# Patient Record
Sex: Male | Born: 1940 | Race: White | Hispanic: No | Marital: Married
Health system: Midwestern US, Academic
[De-identification: ages and names within clinical notes are randomized; demographics above are authoritative.]

---

## 2017-04-18 ENCOUNTER — Emergency Department: Admit: 2017-04-18 | Discharge: 2017-04-18 | Payer: MEDICARE

## 2017-04-18 ENCOUNTER — Encounter: Admit: 2017-04-18 | Discharge: 2017-04-18 | Payer: MEDICARE

## 2017-04-18 ENCOUNTER — Emergency Department: Admit: 2017-04-18 | Discharge: 2017-04-19 | Disposition: A | Discharge status: Home / Self Care | Payer: MEDICARE

## 2017-04-18 DIAGNOSIS — E7800 Pure hypercholesterolemia, unspecified: ICD-10-CM

## 2017-04-18 DIAGNOSIS — R109 Unspecified abdominal pain: ICD-10-CM

## 2017-04-18 DIAGNOSIS — I219 Acute myocardial infarction, unspecified: ICD-10-CM

## 2017-04-18 LAB — COMPREHENSIVE METABOLIC PANEL
Lab: 0.4 mg/dL (ref 0.3–1.2)
Lab: 1.6 mg/dL — ABNORMAL HIGH (ref 0.4–1.24)
Lab: 104 MMOL/L — ABNORMAL LOW (ref 98–110)
Lab: 137 MMOL/L — ABNORMAL LOW (ref 137–147)
Lab: 20 mg/dL (ref 7–25)
Lab: 218 mg/dL — ABNORMAL HIGH (ref 70–100)
Lab: 26 MMOL/L (ref 21–30)
Lab: 3.9 g/dL (ref 3.5–5.0)
Lab: 42 mL/min — ABNORMAL LOW (ref >60)
Lab: 51 mL/min — ABNORMAL LOW (ref >60)
Lab: 6.1 g/dL (ref 6.0–8.0)
Lab: 7 K/UL (ref 3–12)
Lab: 7 U/L (ref 7–56)
Lab: 71 U/L — ABNORMAL LOW (ref 25–110)
Lab: 8.3 mg/dL — ABNORMAL LOW (ref 8.5–10.6)
Lab: 9 U/L (ref 7–40)

## 2017-04-18 LAB — CBC AND DIFF
Lab: 0 10*3/uL (ref 0–0.20)
Lab: 0.1 10*3/uL (ref 0–0.45)
Lab: 8.3 K/UL — ABNORMAL HIGH (ref 4.5–11.0)

## 2017-04-18 LAB — POC GLUCOSE: Lab: 230 mg/dL — ABNORMAL HIGH (ref 70–100)

## 2017-04-18 LAB — POC LACTATE: Lab: 2.5 MMOL/L — ABNORMAL HIGH (ref 0.5–2.0)

## 2017-04-18 LAB — LIPASE: Lab: 28 U/L — ABNORMAL LOW (ref 11–82)

## 2017-04-18 MED ORDER — IOHEXOL 350 MG IODINE/ML IV SOLN
70 mL | Freq: Once | INTRAVENOUS | 0 refills | Status: CP
Start: 2017-04-18 — End: ?
  Administered 2017-04-19: 01:00:00 70 mL via INTRAVENOUS

## 2017-04-18 MED ORDER — SODIUM CHLORIDE 0.9 % IJ SOLN
50 mL | Freq: Once | INTRAVENOUS | 0 refills | Status: CP
Start: 2017-04-18 — End: ?
  Administered 2017-04-19: 01:00:00 50 mL via INTRAVENOUS

## 2017-04-18 MED ORDER — LACTATED RINGERS IV SOLP
1000 mL | Freq: Once | INTRAVENOUS | 0 refills | Status: CP
Start: 2017-04-18 — End: ?
  Administered 2017-04-19: 02:00:00 1000 mL via INTRAVENOUS

## 2017-04-18 NOTE — ED Notes
Pt presents to ED c/o diarrhea for several weeks. He had a bowel resection in October of this year and states he had diarrhea prior to that. Pt c/o mucusy discharge from his rectum in the morning and pain near his belly button. Wife states he has had some bowel incontinence and the smell has been so bad it has made her gag. Has had to change his underwear 3 times this morning. Has had a negative test for c-diff in the last few weeks but not in the last week.     Report to [place], [facility]ity fundraiser.

## 2017-04-19 DIAGNOSIS — R1033 Periumbilical pain: ICD-10-CM

## 2017-04-19 DIAGNOSIS — R531 Weakness: ICD-10-CM

## 2017-04-19 DIAGNOSIS — E1165 Type 2 diabetes mellitus with hyperglycemia: ICD-10-CM

## 2017-04-19 DIAGNOSIS — R197 Diarrhea, unspecified: Principal | ICD-10-CM

## 2017-04-19 LAB — URINALYSIS, MICROSCOPIC

## 2017-04-19 LAB — URINALYSIS DIPSTICK
Lab: NEGATIVE
Lab: NEGATIVE

## 2017-04-19 NOTE — ED Notes
Pt to CT with Will tech.

## 2017-04-19 NOTE — ED Notes
D/C and follow up care instructions given to pt. Pt verbalizes understanding. Denies any further questions at this time. Pt out of department via wheelchair with all belongings.

## 2017-04-19 NOTE — ED Notes
Gave report to Lizzy RN.

## 2017-04-19 NOTE — ED Notes
Pt to the restroom in wheelchair.

## 2021-04-29 ENCOUNTER — Encounter: Admit: 2021-04-29 | Discharge: 2021-04-29 | Payer: MEDICARE

## 2021-05-07 ENCOUNTER — Encounter: Admit: 2021-05-07 | Discharge: 2021-05-07 | Payer: MEDICARE

## 2021-05-07 ENCOUNTER — Ambulatory Visit: Admit: 2021-05-07 | Discharge: 2021-05-07 | Payer: MEDICARE

## 2021-05-07 VITALS — BP 121/70 | HR 81 | Resp 18 | Ht 72.0 in | Wt 180.0 lb

## 2021-05-07 DIAGNOSIS — E7800 Pure hypercholesterolemia, unspecified: Secondary | ICD-10-CM

## 2021-05-07 DIAGNOSIS — I219 Acute myocardial infarction, unspecified: Secondary | ICD-10-CM

## 2021-05-07 DIAGNOSIS — M47816 Spondylosis without myelopathy or radiculopathy, lumbar region: Principal | ICD-10-CM

## 2021-05-07 DIAGNOSIS — M48062 Spinal stenosis, lumbar region with neurogenic claudication: Secondary | ICD-10-CM

## 2021-05-07 NOTE — Progress Notes
[facility] - Interventional Pain  NEW PATIENT HISTORY AND PHYSICAL  Subjective     Chief Complaint:   Chief Complaint   Patient presents with   ? Back Pain     LOW BACK PAIN, LEFT LEG PAIN       HPI: Willie Joseph is a 81 y.o. male who  has a past medical history of Diabetes mellitus, Essential hypertension, Hypercholesteremia, and Myocardial infarct (HCC). who presents for evaluation.    The pain is in the    Low back. Left leg weakness.     Pain started:    Decades ago    Initial inciting injury or event:    Mechanical injury in the 70s    Numbness/tingling:    No    The pain averages    5/10    The pain is described as    Aching, no radicular component    The pain is exacerbated by      Walking, activity. Standing and walking causes him to have claudicant symptoms in the LLE.    The pain is partially alleviated by      Resting         PRIOR MEDICATIONS:   Effective  Gabapentin  Tramadol     Ineffective      Unable to tolerate      Never  Gabapentin   Lyrica  Ami/Nortriptyline  Cymbalta  Tizanidine  NSAID  Acetaminophen    PRIOR INTERVENTIONS:   Effective      Ineffective  SCS-explanted  Back surgery at least ten years ago  Neck Surgery      Selestino EMILE MENTION denies any recent fevers, chills, infection, antibiotics, bowel or bladder incontinence, saddle anesthesia, bleeding issues.    Patient is on plavix    ROS: All 14 systems reviewed and found to be negative except as above and as follows.    Past Medical History:  Medical History:   Diagnosis Date   ? Diabetes mellitus    ? Essential hypertension    ? Hypercholesteremia    ? Myocardial infarct [facility])        Family History:  No family history on file.    Social History:  [place] [id number]    Social History     Socioeconomic History   ? Marital status: Married   Tobacco Use   ? Smoking status: Former     Packs/day: 4.00     Years: 24.00     Pack years: 96.00     Types: Cigarettes     Quit date: 05/02/1982     Years since quitting: 39.0   ? Smokeless tobacco: Never   Substance and Sexual Activity   ? Alcohol use: No     Comment: on occasion   ? Drug use: No       Allergies:  Allergies   Allergen Reactions   ? Hydrocortisone AGITATION       Medications:    Current Outpatient Medications:   ?  amLODIPine (NORVASC) 10 mg tablet, Take 1 tablet by mouth daily. (Patient taking differently: Take 2.5 mg by mouth daily.), Disp: , Rfl:   ?  ascorbic acid (vitamin C) 500 mg tablet, Take 500 mg by mouth daily., Disp: , Rfl:   ?  aspirin EC 81 mg tablet, Take 81 mg by mouth daily. Take with food., Disp: , Rfl:   ?  atorvastatin (LIPITOR) 40 mg tablet, Take 1 tablet by mouth daily., Disp:  90 tablet, Rfl: 3  ?  calcium citrate-Vitamin D3 (CITRACAL +D3) 315 mg-6.25 mcg (250 unit) tablet, Take 1 tablet by mouth daily., Disp: , Rfl:   ?  clopiDOGrel (PLAVIX) 75 mg tablet, Take 75 mg by mouth daily., Disp: , Rfl:   ?  dulaglutide (TRULICITY SC), Inject  under the skin., Disp: , Rfl:   ?  dutasteride-tamsulosin(+) (JALYN) 0.5-0.4 mg capsule, Take  by mouth daily., Disp: , Rfl:   ?  finasteride (PROSCAR) 5 mg tablet, Take 5 mg by mouth daily., Disp: , Rfl:   ?  fish oil- omega 3-DHA/EPA 300/1,000 mg capsule, Take 1 capsule by mouth daily., Disp: , Rfl:   ?  gabapentin (NEURONTIN) 300 mg capsule, Take 300 mg by mouth every 8 hours., Disp: , Rfl:   ?  glimepiride (AMARYL) 2 mg PO tablet, Take 1 Tab by mouth every morning. (Patient taking differently: Take 10 mg by mouth every morning.), Disp: 30 Tab, Rfl: 0  ?  glucosam/chondro/herb 149/hyal (GLUCOS CHOND CPLX ADVANCED PO), Take  by mouth., Disp: , Rfl:   ?  INVOKANA 300 mg tablet, , Disp: , Rfl:   ?  isosorbide mononitrate SR (IMDUR) 30 mg tablet, Take 30 mg by mouth every morning., Disp: , Rfl:   ?  levothyroxine (SYNTHROID) 25 mcg tablet, Take 25 mcg by mouth daily., Disp: , Rfl:   ?  lisinopril (PRINIVIL; ZESTRIL) 10 mg tablet, Take 20 mg by mouth twice daily., Disp: , Rfl:   ?  metformin (GLUCOPHAGE) 1,000 mg PO tablet, Take 1 Tab by mouth twice daily with meals., Disp: 60 Tab, Rfl: 0  ?  methylsulfonylmethane (MSM PO), Take  by mouth., Disp: , Rfl:   ?  metoprolol XL (TOPROL XL) 25 mg extended release tablet, Take 25 mg by mouth twice daily., Disp: , Rfl:   ?  multivitamin with minerals (MULTIVITAMIN & MINERAL FORMULA PO), Take  by mouth., Disp: , Rfl:   ?  nitroglycerin (NITROSTAT) 0.4 mg tablet, Place 0.4 mg under tongue every 5 minutes as needed for Chest Pain. [place]erance of 3 tablets, call 911., Disp: , Rfl:   ?  omeprazole DR(+) (PRILOSEC) 40 mg capsule, Take 40 mg by mouth daily before breakfast., Disp: , Rfl:   ?  Salmon Oil-Omega-3 Fatty Acids (SALMON OIL-1000) 1,000-200 mg cap, Take  by mouth., Disp: , Rfl:   ?  tamsulosin (FLOMAX) 0.4 mg capsule, , Disp: , Rfl:   ?  traMADol (ULTRAM) 50 mg tablet, Take 50 mg by mouth every 4 hours as needed for Pain., Disp: , Rfl:     Physical examination:   BP 121/70 (BP Source: Arm, Right Upper, Patient Position: Sitting)  - Pulse 81  - Resp 18  - Ht 182.9 cm (6')  - Wt 81.6 kg (180 lb)  - SpO2 97%  - BMI 24.41 kg/m?   Pain Score: Eight    General: The patient is a well-developed, well nourished 81 y.o. male in no acute distress.   HEENT: Head is normocephalic and atraumatic. Pupils are equal and reactive to light bilaterally.   Cardiac: Based on palpation, pulse appears to be regular rate and rhythm.   Pulmonary: The patient has unlabored respirations and bilateral symmetric chest excursion.   Abdomen: Soft, nontender, and nondistended. There is no rebound or guarding.   Extremities: No clubbing, cyanosis, or edema.     Neurologic:   The patient is alert and oriented times 3.   Cranial nerves II through XII  are intact without any focal deficits.     Musculoskeletal:   Gait is unsteady.    L-Spine   There is no paraspinal tenderness. Paraspinal muscle tone is normal.  Facet loading is negative.  There is no tenderness or radiating pain with palpation over the SI joints, piriformis, or greater trochanteric bursae bilaterally.  FABER is negative.  ROM with flexion, extension, rotation, and lateral bending is intact.  Strength is equal and adequate bilaterally in the flexors and extensors of the bilateral lower extremities.   Reflexes are 2/4 at the patella and achilles bilaterally.   SLR is negative bilaterally.      MRI C-Spine Results:    No results found for this or any previous visit.    No results found for this or any previous visit.    No results found for this or any previous visit.    No results found for this or any previous visit.      MRI L-Spine Results:    No results found for this or any previous visit.    No results found for this or any previous visit.    No results found for this or any previous visit.    No results found for this or any previous visit.     No results found for this or any previous visit.      MRI T-Spine Results:    No results found for this or any previous visit.    No results found for this or any previous visit.    No results found for this or any previous visit.    No results found for this or any previous visit.      Last Cr and LFT's:  Creatinine   Date Value Ref Range Status   04/18/2017 1.60 (H) 0.4 - 1.24 MG/DL Final     AST (SGOT)   Date Value Ref Range Status   04/18/2017 9 7 - 40 U/L Final     ALT (SGPT)   Date Value Ref Range Status   04/18/2017 7 7 - 56 U/L Final     Alk Phosphatase   Date Value Ref Range Status   04/18/2017 71 25 - 110 U/L Final     Total Bilirubin   Date Value Ref Range Status   04/18/2017 0.4 0.3 - 1.2 MG/DL Final          Assessment:    Willie Joseph is a 81 y.o. male who  has a past medical history of Diabetes mellitus, Essential hypertension, Hypercholesteremia, and Myocardial infarct (HCC). who presents for evaluation of pain.    The pain complaints are most likely due to:    1. Spondylosis without myelopathy or radiculopathy, lumbar region            Patient has had an adequate trial of > 3 month of rest, exercise, multimodal treatment, and the passage of time without improvement of symptoms. The pain has significant impact on the daily quality of life.     Plan:  1. Continue current regimen.   2. OSH  MRI L-spine wo contrast obtained  3. Continue lifestyle modifications. Will refer to formalized outpatient PT.   4. Follow up in six weeks.    Risks/benefits of all pharmacologic and interventional treatments discussed and questions answered.     Annye Rusk, MD  Interventional Pain Medicine Fellow, PGY-5  Available on Voalte      Thank you for this kind referral for consultation. Please feel  free to contact me with any questions or concerns.     ATTESTATION    I personally performed the key portions of the E/M visit, discussed case with fellow and concur with fellow documentation of history, physical exam, assessment, and treatment plan unless otherwise noted.    Staff name:  Lizbeth Bark, MD Date:  05/07/2021

## 2021-05-10 ENCOUNTER — Encounter: Admit: 2021-05-10 | Discharge: 2021-05-10 | Payer: MEDICARE

## 2021-05-10 DIAGNOSIS — E7800 Pure hypercholesterolemia, unspecified: Secondary | ICD-10-CM

## 2021-05-10 DIAGNOSIS — I219 Acute myocardial infarction, unspecified: Secondary | ICD-10-CM

## 2021-05-10 DIAGNOSIS — M1990 Unspecified osteoarthritis, unspecified site: Secondary | ICD-10-CM

## 2021-05-11 ENCOUNTER — Encounter: Admit: 2021-05-11 | Discharge: 2021-05-11 | Payer: MEDICARE

## 2021-05-11 NOTE — Progress Notes
I printed off the order and the demographics, faxed PT order to [facility] at [phone], fax conformation was received.

## 2021-06-11 ENCOUNTER — Encounter: Admit: 2021-06-11 | Discharge: 2021-06-11 | Payer: MEDICARE

## 2021-06-18 ENCOUNTER — Encounter: Admit: 2021-06-18 | Discharge: 2021-06-18 | Payer: MEDICARE

## 2021-06-18 ENCOUNTER — Ambulatory Visit: Admit: 2021-06-18 | Discharge: 2021-06-19 | Payer: MEDICARE

## 2021-06-18 DIAGNOSIS — M5416 Radiculopathy, lumbar region: Secondary | ICD-10-CM

## 2021-06-18 DIAGNOSIS — I219 Acute myocardial infarction, unspecified: Secondary | ICD-10-CM

## 2021-06-18 DIAGNOSIS — E7800 Pure hypercholesterolemia, unspecified: Secondary | ICD-10-CM

## 2021-06-18 DIAGNOSIS — M1990 Unspecified osteoarthritis, unspecified site: Secondary | ICD-10-CM

## 2021-06-18 NOTE — Progress Notes
[facility] CLINIC NOTE       SUBJECTIVE: 81 year old male presenting in follow-up from last visit on 05/07/2021.  At that time he was diagnosed with claudicant low back pain, axial low back pain.  He was sent for formal physical therapy and we tracked down his MRI.  His MRI shows that he has had an L2-S1 fusion.  Multiple levels of continued stenosis.  He has continued pain in his back that radiates down his legs bilaterally when he stands for any amount of time or walks any distance.  He feels like he cannot walk very far, not community distances.  He is not getting any significant relief with physical therapy medications.  He is interested in next step of therapy.         Review of Systems    Current Outpatient Medications:   ?  amLODIPine (NORVASC) 10 mg tablet, Take 1 tablet by mouth daily. (Patient taking differently: Take 2.5 mg by mouth daily.), Disp: , Rfl:   ?  ascorbic acid (vitamin C) 500 mg tablet, Take one tablet by mouth daily., Disp: , Rfl:   ?  aspirin EC 81 mg tablet, Take one tablet by mouth daily. Take with food., Disp: , Rfl:   ?  atorvastatin (LIPITOR) 40 mg tablet, Take 1 tablet by mouth daily., Disp: 90 tablet, Rfl: 3  ?  calcium citrate-Vitamin D3 (CITRACAL +D3) 315 mg-6.25 mcg (250 unit) tablet, Take one tablet by mouth daily., Disp: , Rfl:   ?  clopiDOGrel (PLAVIX) 75 mg tablet, Take one tablet by mouth daily., Disp: , Rfl:   ?  dulaglutide (TRULICITY SC), Inject  under the skin., Disp: , Rfl:   ?  dutasteride-tamsulosin(+) (JALYN) 0.5-0.4 mg capsule, Take  by mouth daily., Disp: , Rfl:   ?  finasteride (PROSCAR) 5 mg tablet, Take one tablet by mouth daily., Disp: , Rfl:   ?  fish oil- omega 3-DHA/EPA 300/1,000 mg capsule, Take one capsule by mouth daily., Disp: , Rfl:   ?  gabapentin (NEURONTIN) 300 mg capsule, Take one capsule by mouth every 8 hours., Disp: , Rfl:   ?  glimepiride (AMARYL) 2 mg PO tablet, Take 1 Tab by mouth every morning. (Patient taking differently: Take five tablets by mouth every morning.), Disp: 30 Tab, Rfl: 0  ?  glucosam/chondro/herb 149/hyal (GLUCOS CHOND CPLX ADVANCED PO), Take  by mouth., Disp: , Rfl:   ?  INVOKANA 300 mg tablet, , Disp: , Rfl:   ?  isosorbide mononitrate SR (IMDUR) 30 mg tablet, Take one tablet by mouth every morning., Disp: , Rfl:   ?  levothyroxine (SYNTHROID) 25 mcg tablet, Take one tablet by mouth daily., Disp: , Rfl:   ?  lisinopril (PRINIVIL; ZESTRIL) 10 mg tablet, Take two tablets by mouth twice daily., Disp: , Rfl:   ?  metformin (GLUCOPHAGE) 1,000 mg PO tablet, Take 1 Tab by mouth twice daily with meals., Disp: 60 Tab, Rfl: 0  ?  methylsulfonylmethane (MSM PO), Take  by mouth., Disp: , Rfl:   ?  metoprolol XL (TOPROL XL) 25 mg extended release tablet, Take one tablet by mouth twice daily., Disp: , Rfl:   ?  multivitamin with minerals (MULTIVITAMIN & MINERAL FORMULA PO), Take  by mouth., Disp: , Rfl:   ?  nitroglycerin (NITROSTAT) 0.4 mg tablet, Place one tablet under tongue every 5 minutes as needed for Chest Pain. [place] of 3 tablets, call 911., Disp: , Rfl:   ?  omeprazole DR(+) (PRILOSEC)  40 mg capsule, Take one capsule by mouth daily before breakfast., Disp: , Rfl:   ?  Salmon Oil-Omega-3 Fatty Acids (SALMON OIL-1000) 1,000-200 mg cap, Take  by mouth., Disp: , Rfl:   ?  tamsulosin (FLOMAX) 0.4 mg capsule, , Disp: , Rfl:   ?  traMADol (ULTRAM) 50 mg tablet, Take one tablet by mouth every 4 hours as needed for Pain., Disp: , Rfl:   Allergies   Allergen Reactions   ? Hydrocortisone AGITATION     Physical Exam  Vitals:    06/18/21 1117   BP: 139/62   BP Source: Arm, Left Upper   Pulse: 90   SpO2: 97%   PainSc: Seven        Pain Score: Seven  There is no height or weight on file to calculate BMI.  Gen: comfortable, NAD ?  HEENT: NCAT, anicteric sclera ?  Card: Extremities warm, well-perfused, cap refill <2sec ?  Pulm: no distress, not cyanotic ?  Abd: soft, non-distended ?  Skin: Skin is warm and dry.  Psychiatric: normal mood and affect. Behavior is normal.     Neuro ?  CNII-XII grossly normal ?  Mental status appropriate     MSK: ?  Inspection: grossly symmetric, no obvious deformity, no erythema ? ?             IMPRESSION:  1. Lumbar stenosis with neurogenic claudication    2. Lumbar radiculopathy          PLAN:  Scheduling a caudal epidural steroid injection.  Reaching out to his PCP to get the approval to stop his Plavix prior to injection.

## 2021-06-18 NOTE — Telephone Encounter
Fax sent to Dr Gilles Chiquito office in [place] asking for pt to hold plavix 7 days prior to Caudal esi on 2.28.23

## 2021-06-19 DIAGNOSIS — M48062 Spinal stenosis, lumbar region with neurogenic claudication: Secondary | ICD-10-CM

## 2021-06-29 ENCOUNTER — Ambulatory Visit: Admit: 2021-06-29 | Discharge: 2021-06-29 | Payer: MEDICARE

## 2021-06-29 ENCOUNTER — Encounter: Admit: 2021-06-29 | Discharge: 2021-06-29 | Payer: MEDICARE

## 2021-06-29 DIAGNOSIS — I219 Acute myocardial infarction, unspecified: Secondary | ICD-10-CM

## 2021-06-29 DIAGNOSIS — E7800 Pure hypercholesterolemia, unspecified: Secondary | ICD-10-CM

## 2021-06-29 DIAGNOSIS — M5416 Radiculopathy, lumbar region: Secondary | ICD-10-CM

## 2021-06-29 DIAGNOSIS — M1990 Unspecified osteoarthritis, unspecified site: Secondary | ICD-10-CM

## 2021-06-29 DIAGNOSIS — M48062 Spinal stenosis, lumbar region with neurogenic claudication: Secondary | ICD-10-CM

## 2021-06-29 MED ORDER — LIDOCAINE (PF) 10 MG/ML (1 %) IJ SOLN
2 mL | Freq: Once | INTRAMUSCULAR | 0 refills | Status: CP
Start: 2021-06-29 — End: ?

## 2021-06-29 MED ORDER — IOHEXOL 240 MG IODINE/ML IV SOLN
1 mL | Freq: Once | EPIDURAL | 0 refills | Status: CP
Start: 2021-06-29 — End: ?

## 2021-06-29 MED ORDER — TRIAMCINOLONE ACETONIDE 40 MG/ML IJ SUSP
80 mg | Freq: Once | EPIDURAL | 0 refills | Status: CP
Start: 2021-06-29 — End: ?

## 2021-06-29 NOTE — Procedures
Attending Surgeon: Lizbeth Bark, MD    Anesthesia: Local    Pre-Procedure Diagnosis:   1. Lumbar stenosis with neurogenic claudication    2. Lumbar radiculopathy        Post-Procedure Diagnosis:   1. Lumbar stenosis with neurogenic claudication    2. Lumbar radiculopathy              AMB SPINE INJECT INTERLAM LMBR/SAC  Procedure: epidural - interlaminar    Laterality: n/a    Location: caudal -  Caudal      Consent:   Consent obtained: verbal and written  Consent given by: patient  Risks discussed: allergic reaction, bleeding, bruising, infection, nerve damage, no change or worsening in pain and reaction to medication  Alternatives discussed: alternative treatment  Discussed with patient the purpose of the treatment/procedure, other ways of treating my condition, including no treatment/ procedure and the risks and benefits of the alternatives. Patient has decided to proceed with treatment/procedure.        Universal Protocol:  Relevant documents: relevant documents present and verified  Test results: test results available and properly labeled  Imaging studies: imaging studies available  Required items: required blood products, implants, devices, and special equipment not available  Site marked: the operative site was marked  Patient identity confirmed: Patient identify confirmed verbally with patient.        Time out: Immediately prior to procedure a time out was called to verify the correct patient, procedure, equipment, support staff and site/side marked as required      Procedures Details:   Indications: pain   Prep: chlorhexidine  Number of Levels: 1  Approach: midline  Guidance: fluoroscopy  Contrast: Procedure confirmed with contrast under live fluoroscopy.  Needle and Epidural Catheter: quincke  Needle size: 22 G  Patient tolerance: Patient tolerated the procedure well with no immediate complications. Pressure was applied, and hemostasis was accomplished.  Outcome: Pain improved  Comments: DESCRIPTION OF PROCEDURE:  The procedure risks and benefits were explained and informed consent was obtained from the patient.  The patient was positioned prone on the fluoroscopy table with a pillow under the abdomen in the hope to reduce lumbar lordosis.  Blood pressure cuff and oxygen saturation monitors were attached.  The patient was monitored throughout the procedure.  The sacral hiatus was identified with the use of fluoroscopy in the lateral view.  The skin was prepped with chloroprep and draped in aseptic fashion.  The skin and subcutaneous tissue were anesthetized using 4 mL of 1 percent lidocaine with a 25-gauge needle.  A 5-inch 22-gauge needle was slowly advanced in the lateral view towards the sacral hiatus.  Subsequently once the needle was in good position, utilized 1 mL of Isovue contrast, which demonstrated epidural spread.  After negative aspiration, a 5 mL solution containing 2 mL of normal saline and 2mL of 1 percent lidocaine and 10 mg of dexamethasone was injected in increments.  The stylet was reinserted and needle was removed.  After the procedure, the patient's blood pressure, heart rate, oxygen saturation, and VAS were recorded in the chart.     There were no complications. The patient tolerated procedure well and was brought to the recovery room for observation in stable condition with discharge instructions     PLAN OF CARE:  The patient follow up in clinic in 2-3 weeks.     The patient was advised to contact the [facility] Center for any of the following.  Fever, chills, or night sweats.  New onset of severe sharp pain.  Any new upper or lower extremity weakness or numbness.  Any questions regarding the procedure.     If unable to contact the [facility] Center, the patient was instructed to go to local emergency room.   This patient's clinical history, exam, AND imaging support radiculopathy AND there is a significant impact on quality of life and function AND the pain has been present for at least 4 weeks AND they have failed to improve with noninvasive conservative care.            Administrations This Visit     iohexoL (OMNIPAQUE-240) 240 mg/mL injection 1 mL     Admin Date  06/29/2021 Action  Given Dose  1 mL Route  Epidural Administered By  Baird Kay, RN          lidocaine PF 1% (10 mg/mL) injection 2 mL     Admin Date  06/29/2021 Action  Given Dose  2 mL Route  Injection Administered By  Baird Kay, RN          triamcinolone acetonide (KENALOG-40) injection 80 mg     Admin Date  06/29/2021 Action  Given Dose  80 mg Route  Epidural Administered By  Baird Kay, RN              Estimated blood loss: none or minimal  Specimens: none  Patient tolerated the procedure well with no immediate complications. Pressure was applied, and hemostasis was accomplished.

## 2021-06-29 NOTE — Discharge Instructions - Supplementary Instructions
..  General Post-Procedure Instructions: Pain Injection      Procedure Completed Today:  Joint Injection (hip, knee, shoulder)  Epidural Steroid Injection (Cervical; Thoracic; Lumbar)  Transforaminal Steroid Injection (Cervical; Lumbar)  Facet Joint Injection  Other: ___________________________    Important information following your procedure today:  You may drive today     If you had sedation, you may NOT drive today  Rest at home for the next 6 hours.  You may then begin to resume your normal activities.  DO NOT drive any vehicle, operate any power tools, drink alcohol, make any major decisions, or sign any legal documents for the next 12 hours.  Pain relief may not be immediate. It is possible you may even experience an increase in pain during the first 24-48 hours followed by a gradual decrease of your pain.  Though the procedure is generally safe, and complications are rare, we do ask that you be aware of any of the following:  Any swelling, persistent redness, new bleeding or drainage from the site of the injection.  You should not experience a severe headache.  You should not run a fever over 101oF.  New onset of sharp, severe back and or neck pain.  New onset of upper or lower extremity numbness or weakness.  New difficulty controlling bowel or bladder function after injection.  New shortness of breath.  If any of these occur, please call to report this occurrence to a nurse at [phone]. If you are calling after 4:00pm, on the weekends or holidays please call [phone] and ask to have the pain management resident physician on call for the physician paged or go to your local emergency room.   You may experience soreness at the injection site. Ice can be applied at 20-minute intervals for the first 24 hours. The following day you may alternate ice with heat if you are experiencing muscle tightness, otherwise continue with ice. Ice works best at decreasing pain. Avoid application of direct heat, hot showers or hot tubs today.  Avoid strenuous activity today. You many resume your regular activities and exercise tomorrow.  Patients with diabetes may see an elevation in blood sugars for 7-10 days after the injection. It is important to pay close attention to your diet, check your blood sugars daily and report extreme elevations to the physician that treats your diabetes.  Patients taking daily blood thinners can resume their regular dose this evening.  It is important that you take all medications ordered by your pain physician. Taking medications as ordered is an important part of your pain care plan. If you cannot continue the medication plan, please notify the physician.    Possible side effects to steroids that may occur:  Flushing or redness of the face  Irritability  Fluid retention  Change in women's menses    Follow up appointment as needed. In the event you are unable to keep an appointment, please notify the scheduler 24 hours in advance at [phone].

## 2021-06-29 NOTE — Progress Notes
SPINE CENTER  INTERVENTIONAL PAIN PROCEDURE HISTORY AND PHYSICAL    No chief complaint on file.      HISTORY OF PRESENT ILLNESS:  Willie Joseph is a 81 y.o. year old male who presents for injection.  Denies fevers, chills, or recent hospitalizations.  Patient denies currently taking blood thinning medications.       Medical History:   Diagnosis Date    Arthritis     Diabetes mellitus     Essential hypertension     Hypercholesteremia     Myocardial infarct [facility]        Surgical History:   Procedure Laterality Date    [place]    WRIST SURGERY  2007    right wrist       family history is not on file.    Social History     Socioeconomic History    Marital status: Married   Tobacco Use    Smoking status: Former     Packs/day: 4.00     Years: 24.00     Pack years: 96.00     Types: Cigarettes     Quit date: 05/02/1982     Years since quitting: 39.1    Smokeless tobacco: Never   Substance and Sexual Activity    Alcohol use: No     Comment: on occasion    Drug use: No       Allergies   Allergen Reactions    Hydrocortisone AGITATION       There were no vitals filed for this visit.          REVIEW OF SYSTEMS: 10 point ROS obtained and negative except back pain      PHYSICAL EXAM:  General: 81 y.o. male appears stated age, in no acute distress  HEENT: Normocephalic, atraumatic  Neck: No thyroidmegaly  Cardiovascular: Well perfused  Pulmonary: Unlabored respirations  Extremities: No cyanosis, clubbing, or edema  Skin: No lesions seen on exposed skin  Psychiatric:  Appropriate mood and affect  Musculoskeletal: No atrophy.   Neurologic: Antigravity strength in all extremities. CN II -XII grossly intact.  Alert and oriented x 3.           IMPRESSION:    1. Lumbar stenosis with neurogenic claudication    2. Lumbar radiculopathy         PLAN: Caudal Epidural Steroid Injection

## 2021-07-30 ENCOUNTER — Encounter: Admit: 2021-07-30 | Discharge: 2021-07-30 | Payer: MEDICARE

## 2021-07-30 ENCOUNTER — Ambulatory Visit: Admit: 2021-07-30 | Discharge: 2021-07-30 | Payer: MEDICARE

## 2021-07-30 DIAGNOSIS — I219 Acute myocardial infarction, unspecified: Secondary | ICD-10-CM

## 2021-07-30 DIAGNOSIS — M48062 Spinal stenosis, lumbar region with neurogenic claudication: Secondary | ICD-10-CM

## 2021-07-30 DIAGNOSIS — M1990 Unspecified osteoarthritis, unspecified site: Secondary | ICD-10-CM

## 2021-07-30 DIAGNOSIS — M47816 Spondylosis without myelopathy or radiculopathy, lumbar region: Secondary | ICD-10-CM

## 2021-07-30 DIAGNOSIS — E7800 Pure hypercholesterolemia, unspecified: Secondary | ICD-10-CM

## 2021-07-30 NOTE — Progress Notes
[facility] CLINIC NOTE       SUBJECTIVE: 81 year old male presenting in follow-up from last visit on 07/27/2021.  At that time we did a caudal epidural steroid injection.  He reports 100% pain relief.  Unfortunately still having weakness and difficulty walking in his legs.  This has been an ongoing thing.  He is interested in getting involved with some physical therapy at his local [facility].         Review of Systems    Current Outpatient Medications:   ?  amLODIPine (NORVASC) 10 mg tablet, Take 1 tablet by mouth daily. (Patient taking differently: Take 2.5 mg by mouth daily.), Disp: , Rfl:   ?  ascorbic acid (vitamin C) 500 mg tablet, Take one tablet by mouth daily., Disp: , Rfl:   ?  aspirin EC 81 mg tablet, Take one tablet by mouth daily. Take with food., Disp: , Rfl:   ?  atorvastatin (LIPITOR) 40 mg tablet, Take 1 tablet by mouth daily., Disp: 90 tablet, Rfl: 3  ?  calcium citrate-Vitamin D3 (CITRACAL +D3) 315 mg-6.25 mcg (250 unit) tablet, Take one tablet by mouth daily., Disp: , Rfl:   ?  clopiDOGrel (PLAVIX) 75 mg tablet, Take one tablet by mouth daily., Disp: , Rfl:   ?  dulaglutide (TRULICITY SC), Inject  under the skin., Disp: , Rfl:   ?  dutasteride-tamsulosin(+) (JALYN) 0.5-0.4 mg capsule, Take  by mouth daily., Disp: , Rfl:   ?  finasteride (PROSCAR) 5 mg tablet, Take one tablet by mouth daily., Disp: , Rfl:   ?  fish oil- omega 3-DHA/EPA 300/1,000 mg capsule, Take one capsule by mouth daily., Disp: , Rfl:   ?  gabapentin (NEURONTIN) 300 mg capsule, Take one capsule by mouth every 8 hours., Disp: , Rfl:   ?  glimepiride (AMARYL) 2 mg PO tablet, Take 1 Tab by mouth every morning. (Patient taking differently: Take five tablets by mouth every morning.), Disp: 30 Tab, Rfl: 0  ?  glucosam/chondro/herb 149/hyal (GLUCOS CHOND CPLX ADVANCED PO), Take  by mouth., Disp: , Rfl:   ?  INVOKANA 300 mg tablet, , Disp: , Rfl:   ?  isosorbide mononitrate SR (IMDUR) 30 mg tablet, Take one tablet by mouth every morning., Disp: , Rfl:   ?  levothyroxine (SYNTHROID) 25 mcg tablet, Take one tablet by mouth daily., Disp: , Rfl:   ?  lisinopril (PRINIVIL; ZESTRIL) 10 mg tablet, Take two tablets by mouth twice daily., Disp: , Rfl:   ?  metformin (GLUCOPHAGE) 1,000 mg PO tablet, Take 1 Tab by mouth twice daily with meals., Disp: 60 Tab, Rfl: 0  ?  methylsulfonylmethane (MSM PO), Take  by mouth., Disp: , Rfl:   ?  metoprolol XL (TOPROL XL) 25 mg extended release tablet, Take one tablet by mouth twice daily., Disp: , Rfl:   ?  multivitamin with minerals (MULTIVITAMIN & MINERAL FORMULA PO), Take  by mouth., Disp: , Rfl:   ?  nitroglycerin (NITROSTAT) 0.4 mg tablet, Place one tablet under tongue every 5 minutes as needed for Chest Pain. [place] of 3 tablets, call 911., Disp: , Rfl:   ?  omeprazole DR(+) (PRILOSEC) 40 mg capsule, Take one capsule by mouth daily before breakfast., Disp: , Rfl:   ?  Salmon Oil-Omega-3 Fatty Acids (SALMON OIL-1000) 1,000-200 mg cap, Take  by mouth., Disp: , Rfl:   ?  tamsulosin (FLOMAX) 0.4 mg capsule, , Disp: , Rfl:   ?  traMADol (ULTRAM) 50 mg tablet,  Take one tablet by mouth every 4 hours as needed for Pain., Disp: , Rfl:   Allergies   Allergen Reactions   ? Hydrocortisone AGITATION     Physical Exam  Vitals:    07/30/21 1117   PainSc: Five        Pain Score: Five  There is no height or weight on file to calculate BMI.  Gen: comfortable, NAD ?  HEENT: NCAT, anicteric sclera ?  Card: Extremities warm, well-perfused, cap refill <2sec ?  Pulm: no distress, not cyanotic ?  Abd: soft, non-distended ?  Skin: Skin is warm and dry.  Psychiatric: normal mood and affect. Behavior is normal.     Neuro ?  CNII-XII grossly normal ?  Mental status appropriate     MSK: ?  Inspection: grossly symmetric, no obvious deformity, no erythema ?             IMPRESSION:  1. Spondylosis without myelopathy or radiculopathy, lumbar region    2. Lumbar stenosis with neurogenic claudication          PLAN: Patient was provided with a referral for formal physical therapy to work on core strengthening and gait and balance.  He will follow-up on an as-needed basis, if the pain returns we could repeat his caudal epidural steroid injection.

## 2021-10-25 ENCOUNTER — Encounter: Admit: 2021-10-25 | Discharge: 2021-10-25 | Payer: MEDICARE

## 2022-05-03 ENCOUNTER — Encounter: Admit: 2022-05-03 | Discharge: 2022-05-03 | Payer: MEDICARE

## 2022-05-06 ENCOUNTER — Encounter: Admit: 2022-05-06 | Discharge: 2022-05-06 | Payer: MEDICARE

## 2022-05-06 ENCOUNTER — Ambulatory Visit: Admit: 2022-05-06 | Discharge: 2022-05-06 | Payer: MEDICARE

## 2022-05-06 DIAGNOSIS — E7800 Pure hypercholesterolemia, unspecified: Secondary | ICD-10-CM

## 2022-05-06 DIAGNOSIS — S01112A Laceration without foreign body of left eyelid and periocular area, initial encounter: Secondary | ICD-10-CM

## 2022-05-06 DIAGNOSIS — I219 Acute myocardial infarction, unspecified: Secondary | ICD-10-CM

## 2022-05-06 DIAGNOSIS — M1990 Unspecified osteoarthritis, unspecified site: Secondary | ICD-10-CM

## 2022-05-06 DIAGNOSIS — S56129A Laceration of flexor muscle, fascia and tendon of unspecified finger at forearm level, initial encounter: Secondary | ICD-10-CM

## 2022-05-06 DIAGNOSIS — S6992XA Unspecified injury of left wrist, hand and finger(s), initial encounter: Secondary | ICD-10-CM

## 2022-05-06 NOTE — Progress Notes
[facility] Hand [facility]      Date of Service: 05/06/2022    Subjective:           Left hand laceration    History of Present Illness  This is an 82 year old right-hand-dominant man who had a tablesaw injury with his left hand approximately 5 days ago.  He was seen elsewhere and closed with sutures and referred here.  He reports some pain primarily around the thumb, he notices that he cannot flex the thumb and notices numbness at the tip.  He uses a walker for ambulation.  Willie Joseph is a 82 y.o. male.     Review of Systems      Objective:          amLODIPine (NORVASC) 10 mg tablet Take 1 tablet by mouth daily. (Patient taking differently: Take 2.5 mg by mouth daily.)    ascorbic acid (vitamin C) 500 mg tablet Take one tablet by mouth daily.    aspirin EC 81 mg tablet Take one tablet by mouth daily. Take with food.    atorvastatin (LIPITOR) 40 mg tablet Take 1 tablet by mouth daily.    calcium citrate-Vitamin D3 (CITRACAL +D3) 315 mg-6.25 mcg (250 unit) tablet Take one tablet by mouth daily.    clopiDOGrel (PLAVIX) 75 mg tablet Take one tablet by mouth daily.    dulaglutide (TRULICITY SC) Inject  under the skin.    dutasteride-tamsulosin(+) (JALYN) 0.5-0.4 mg capsule Take  by mouth daily.    finasteride (PROSCAR) 5 mg tablet Take one tablet by mouth daily.    fish oil- omega 3-DHA/EPA 300/1,000 mg capsule Take one capsule by mouth daily.    gabapentin (NEURONTIN) 300 mg capsule Take one capsule by mouth every 8 hours.    glimepiride (AMARYL) 2 mg PO tablet Take 1 Tab by mouth every morning. (Patient taking differently: Take five tablets by mouth every morning.)    glucosam/chondro/herb 149/hyal (GLUCOS CHOND CPLX ADVANCED PO) Take  by mouth.    INVOKANA 300 mg tablet     isosorbide mononitrate SR (IMDUR) 30 mg tablet Take one tablet by mouth every morning.    levothyroxine (SYNTHROID) 25 mcg tablet Take one tablet by mouth daily.    lisinopril (PRINIVIL; ZESTRIL) 10 mg tablet Take two tablets by mouth twice daily.    metformin (GLUCOPHAGE) 1,000 mg PO tablet Take 1 Tab by mouth twice daily with meals.    methylsulfonylmethane (MSM PO) Take  by mouth.    metoprolol XL (TOPROL XL) 25 mg extended release tablet Take one tablet by mouth twice daily.    multivitamin with minerals (MULTIVITAMIN & MINERAL FORMULA PO) Take  by mouth.    nitroglycerin (NITROSTAT) 0.4 mg tablet Place one tablet under tongue every 5 minutes as needed for Chest Pain. [place] of 3 tablets, call 911.    omeprazole DR(+) (PRILOSEC) 40 mg capsule Take one capsule by mouth daily before breakfast.    Salmon Oil-Omega-3 Fatty Acids (SALMON OIL-1000) 1,000-200 mg cap Take  by mouth.    tamsulosin (FLOMAX) 0.4 mg capsule     traMADol (ULTRAM) 50 mg tablet Take one tablet by mouth every 4 hours as needed for Pain.     Vitals:    05/06/22 0935   PainSc: Seven   Weight: 81.6 kg (180 lb)   Height: 182.9 cm (6')     Body mass index is 24.41 kg/m?Marland Kitchen     Physical Exam  Constitutional:  General: He is not in acute distress.     Appearance: Normal appearance.   Neurological:      Mental Status: He is oriented to person, place, and time.   Psychiatric:         Behavior: Behavior normal.       Ortho Exam  Left hand: There is a complex laceration over the pulp space of the thumb which is closed with nylon sutures.  The nail is in place.  There is some superficial abrasions primarily at the tip of the small finger and base of the index finger with no deeper involvement.  He can fully flex and extend all digits with the exception of the thumb with no active flexion at the IP joint.  He is able to maintain the IP joint at neutral.  He has absent sensation over the tip of the thumb with brisk cap refill.       Assessment and Plan:  I reviewed his radiographs which do not show any fractures.  He has sustained a traumatic laceration to the left thumb with injury to the flexor pollicis longus tendon and his digital nerves.  The laceration is very distal and I am not optimistic that nerve repair is going to be feasible.  We discussed in great detail the idea of performing a repair of his flexor pollicis longus tendon.  The issue is is that a repair is technically possible however this will require significant postoperative rehabilitation and compliance with this.  This is going to be a tremendous challenge for him given his need to use a walker for mobility.  With nonoperative management he may be totally fine.  He may not have any functional limitations despite the inability to flex at the IP joint.  If he is managed nonoperatively and develops functional problems because of this we could always salvage that with a IP joint fusion which would be a much simpler operation which we could do under local anesthesia and not have so much demands of compliance and postoperative rehabilitation.  We discussed this at length, we are going to pursue nonoperative management.  I will get him a custom splint made today.  I would like to see him back in a week to take out his sutures and have another discussion to be sure he is okay with this plan.                       Philomena Course, MD  Associate Professor  Orthopedic Hand Surgery

## 2022-05-06 NOTE — Progress Notes
Hand Therapy Orthosis/Evaluation and Plan of Care:     Name: Willie Joseph   DOB: 11/12/1940   MRN#: [id number]   Referring Physician:Matthew Ulice Brilliant, MD  Insurance: Medicare  Injury/Onset Date: 05/01/2022  Surgical Date: No surgery  Medical Diagnosis: Left thumb flexor tendon injury - not repaired, palmar skin wounds  Treatment Diagnosis: Same  Date of Initial Evaluation: 05/06/2022  Follow-up Physician Appointment:05/13/2022,12:40  Visit #  1       Medicare Treatment Time:  Treatment Time In:10:15  Treatment Time Out: 1100      Subjective: The doctor said it wasn't worth having the surgery.'    Primary Concern/Chief Complaint:Left thumb injury    Mental Status/Cognitive Function: Alert/Able to Follow Instructions/Cooperative    Co-morbidities:  None    PMH/Functional Limitations:  None    Previous History of Similar Symptoms:  None    Previous Treatment for Similar Symptoms:  None    Current Functional Status:      ADL/Self-Care Impairment: Independent    Work Task Impairment: Moderate    Current Functional Limitations: Immobilization with cast/orthosis/prefabricated orthosis, Decreased UE ROM/strength, Decreased independence in ADLs, and Other: Pain    Assistive Devices:  None    Durable Medical Equipment:  None    Employment/Work Status: Retired    Home Situation: Lives Alone    Home Health Care:  None    Home Environment: Not Relevant to UE injury/condition       History of Present Condition/Mechanism of Injury:    Pain: Left:  Location: Hand and Thumb    Pain Rating:   Current: 7/10     Patient Goals: Regain ROM and function of left thumb     Objective:     Evaluation:    Hand Exam     Treatment:     Left:  Thumb     Treatment Provided:    Wound care: Xerophorm and sterile gauze             Orthosis:     Orthosis Fabricated: Finger-Based -  DIP Extension/Stax/Mallet Finger     Purpose of Immobilization:To protect soft tissue    Orthosis Instructions:  Verbally instructed in orthosis/brace care and precautions. Verbally instructed in wearing schedule for orthosis/brace. At all times and Other: at al times except dressing changes.    Assessment:     Problems: Decreased PROM, Decreased AROM, Decreased Strength, Altered Sensation, Edema, Scar/Wound, Soft Tissue Injury, Decreased Self-Care/ADL Independence, and Pain    Rationale for Therapy:Clinical Judgment and Per Physician Order/Referral    Rehab Potential: Good     Contraindications to Therapy:none    Long Term Treatment Goals:    Immobilize with orthosis during healing process to enable patient to resume functional activities post immobilization.    Short Term Treatment Goals:      Goal Number:  1  Goal: Patient to verbalize understanding of orthosis instructions including wearing schedule and precautions.  Goal Status:  Met     Plan Of Care:    Treatment Plan:     Treatment to be provided:Orthosis Fabrication    Frequency of treatment:  Comments:Will follow post next MD appointment PRN.

## 2022-05-13 ENCOUNTER — Encounter: Admit: 2022-05-13 | Discharge: 2022-05-13 | Payer: MEDICARE

## 2022-05-13 ENCOUNTER — Ambulatory Visit: Admit: 2022-05-13 | Discharge: 2022-05-14 | Payer: MEDICARE

## 2022-05-13 DIAGNOSIS — S61112D Laceration without foreign body of left thumb with damage to nail, subsequent encounter: Secondary | ICD-10-CM

## 2022-05-13 DIAGNOSIS — I219 Acute myocardial infarction, unspecified: Secondary | ICD-10-CM

## 2022-05-13 DIAGNOSIS — M1990 Unspecified osteoarthritis, unspecified site: Secondary | ICD-10-CM

## 2022-05-13 DIAGNOSIS — E7800 Pure hypercholesterolemia, unspecified: Secondary | ICD-10-CM

## 2022-05-13 NOTE — Progress Notes
[facility] Hand Surgery      Date of Service: 05/13/2022    Subjective:           Left thumb laceration    History of Present Illness  The patient returns for follow-up of his left thumb tablesaw injury.  At her last visit we identified him having a flexor pollicis longus tendon injury.  We discussed options and decided to pursue nonsurgical management.  He reports some ongoing soreness.  He has been functioning pretty well using his splint.  Willie Joseph is a 82 y.o. male.     Review of Systems      Objective:          amLODIPine (NORVASC) 10 mg tablet Take 1 tablet by mouth daily. (Patient taking differently: Take 2.5 mg by mouth daily.)    ascorbic acid (vitamin C) 500 mg tablet Take one tablet by mouth daily.    aspirin EC 81 mg tablet Take one tablet by mouth daily. Take with food.    atorvastatin (LIPITOR) 40 mg tablet Take 1 tablet by mouth daily.    calcium citrate-Vitamin D3 (CITRACAL +D3) 315 mg-6.25 mcg (250 unit) tablet Take one tablet by mouth daily.    clopiDOGrel (PLAVIX) 75 mg tablet Take one tablet by mouth daily.    dulaglutide (TRULICITY SC) Inject  under the skin.    dutasteride-tamsulosin(+) (JALYN) 0.5-0.4 mg capsule Take  by mouth daily.    finasteride (PROSCAR) 5 mg tablet Take one tablet by mouth daily.    fish oil- omega 3-DHA/EPA 300/1,000 mg capsule Take one capsule by mouth daily.    gabapentin (NEURONTIN) 300 mg capsule Take one capsule by mouth every 8 hours.    glimepiride (AMARYL) 2 mg PO tablet Take 1 Tab by mouth every morning. (Patient taking differently: Take five tablets by mouth every morning.)    glucosam/chondro/herb 149/hyal (GLUCOS CHOND CPLX ADVANCED PO) Take  by mouth.    INVOKANA 300 mg tablet     isosorbide mononitrate SR (IMDUR) 30 mg tablet Take one tablet by mouth every morning.    levothyroxine (SYNTHROID) 25 mcg tablet Take one tablet by mouth daily.    lisinopril (PRINIVIL; ZESTRIL) 10 mg tablet Take two tablets by mouth twice daily. metformin (GLUCOPHAGE) 1,000 mg PO tablet Take 1 Tab by mouth twice daily with meals.    methylsulfonylmethane (MSM PO) Take  by mouth.    metoprolol XL (TOPROL XL) 25 mg extended release tablet Take one tablet by mouth twice daily.    multivitamin with minerals (MULTIVITAMIN & MINERAL FORMULA PO) Take  by mouth.    nitroglycerin (NITROSTAT) 0.4 mg tablet Place one tablet under tongue every 5 minutes as needed for Chest Pain. [place] of 3 tablets, call 911.    omeprazole DR(+) (PRILOSEC) 40 mg capsule Take one capsule by mouth daily before breakfast.    Salmon Oil-Omega-3 Fatty Acids (SALMON OIL-1000) 1,000-200 mg cap Take  by mouth.    tamsulosin (FLOMAX) 0.4 mg capsule     traMADol (ULTRAM) 50 mg tablet Take one tablet by mouth every 4 hours as needed for Pain.     Vitals:    05/13/22 1243   Resp: 18   PainSc: Eight   Weight: 81.6 kg (180 lb)   Height: 182.9 cm (6' 0.01)     Body mass index is 24.41 kg/m?Marland Kitchen     Physical Exam  Ortho Exam  Left hand: The wound over the volar aspect of  the thumb is healing with sutures in place.  There is no erythema or drainage.  He does have absent sensation distal to the laceration.  The thumb is maintained in a slightly extended posture.       Assessment and Plan:  His wound is healed.  We will take out the sutures today.  We again discussed the issues at hand here in terms of performing a tendon repair versus observation and later arthrodesis if he is not satisfied with function.  We again discussed the rationale of this given his age, mobility challenges requiring a walker and the amount of therapy that would be required after tendon repair and I am not sure his function will be much better.  He again reiterated he was okay with nonsurgical management.  We will take the sutures out and he is welcome to follow-up on an as-needed basis.                       Philomena Course, MD  Associate Professor  Orthopedic Hand Surgery

## 2022-12-02 IMAGING — CT ABDOMEN_PELVIS WO(Adult)
2 of 3 series · 12 of 46 positions shown, 14 images · non-contrast
Comparison: none

[Series 2: abdomen_pelvis ax 3.00 br40 s3 · axial · 0.61mm/px · z∈[+1242,+1610]mm · 9 of 134 slices shown, 11 images]
[im 9/134  soft-tissue]
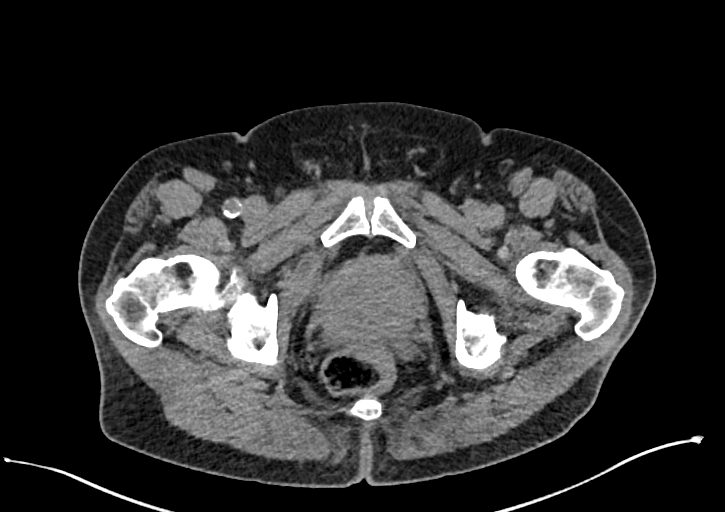
[im 9/134  bone]
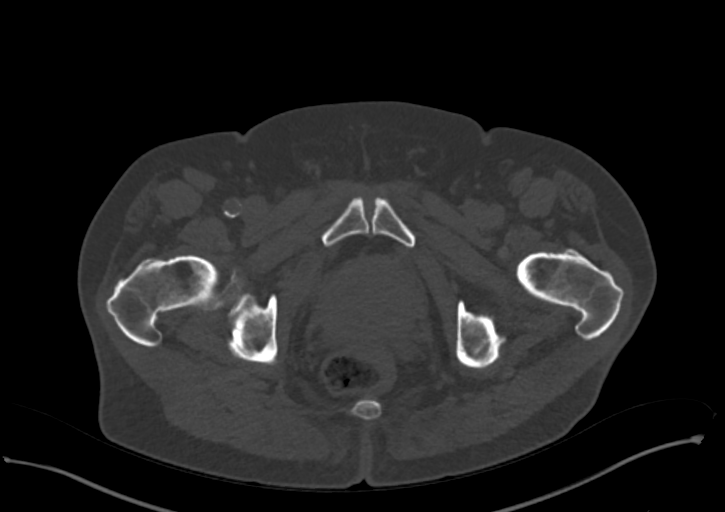
[im 26/134  soft-tissue]
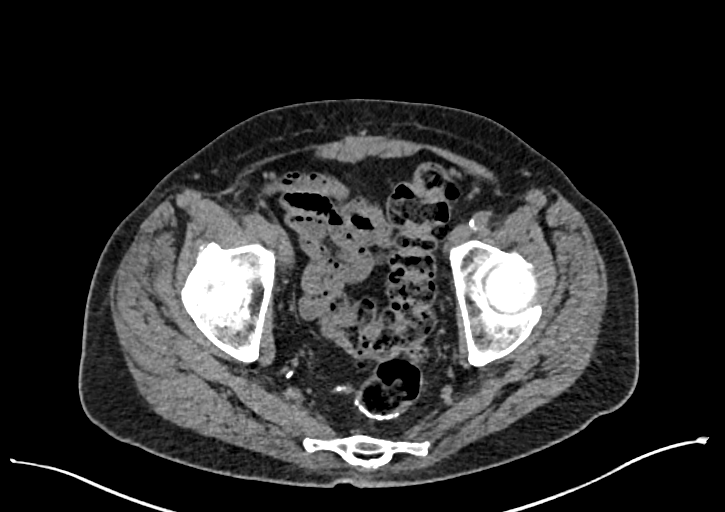
[im 39/134  soft-tissue]
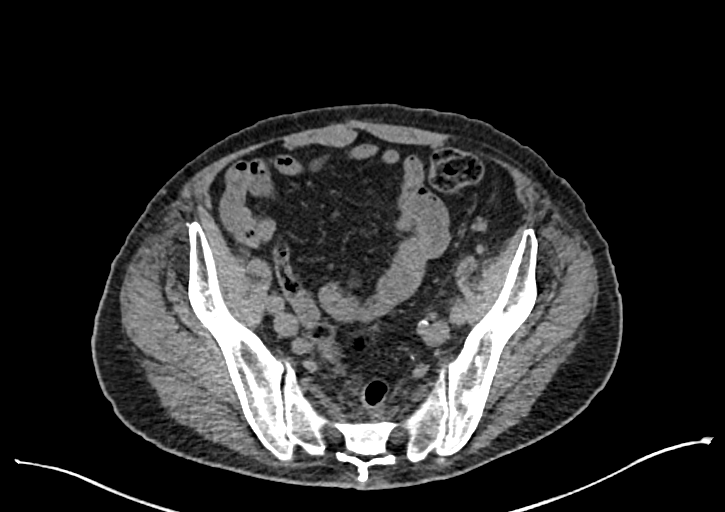
[im 52/134  soft-tissue]
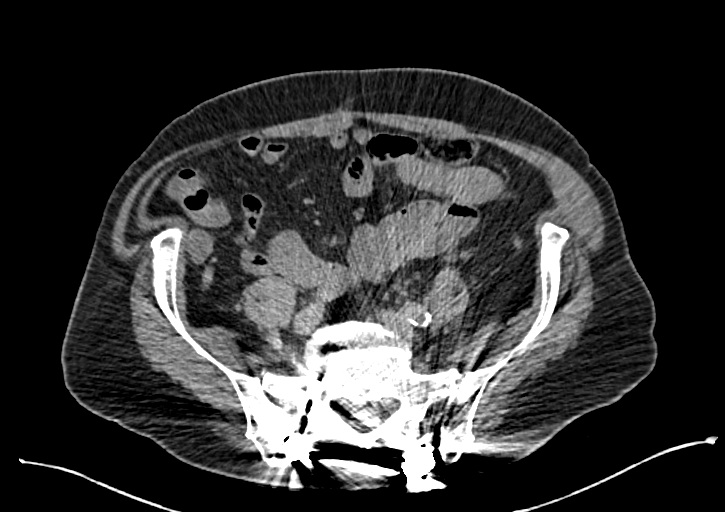
[im 69/134  soft-tissue]
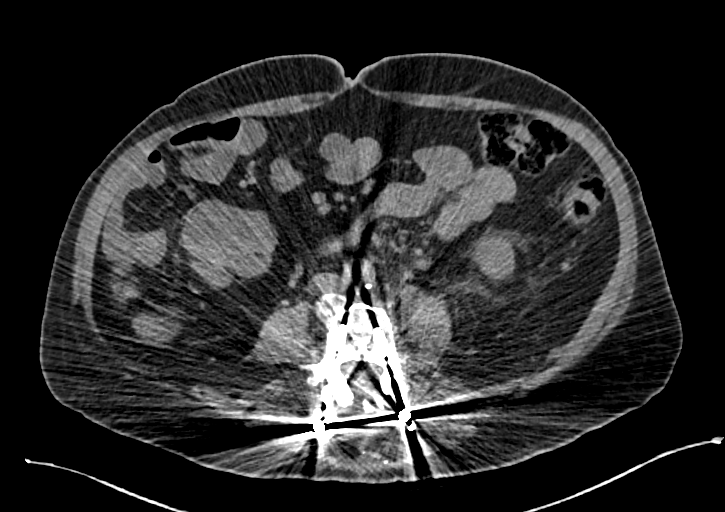
[im 82/134  soft-tissue]
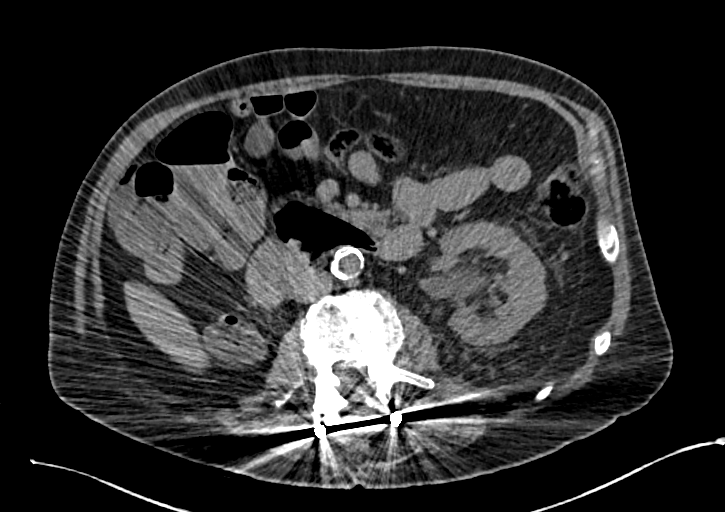
[im 95/134  soft-tissue]
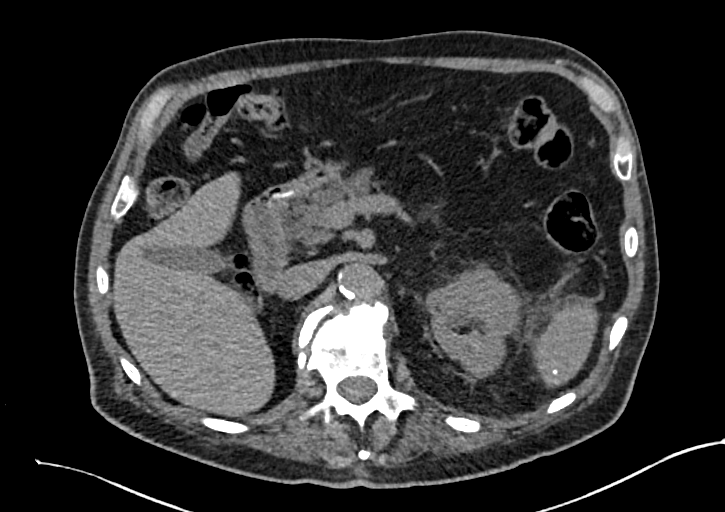
[im 112/134  soft-tissue]
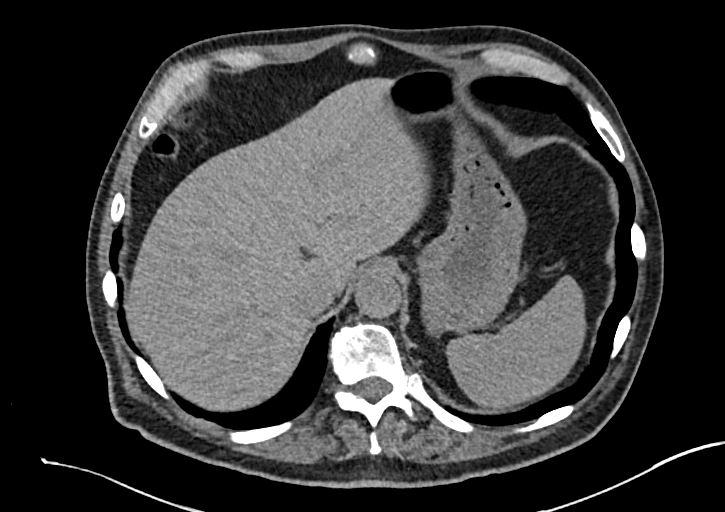
[im 125/134  soft-tissue]
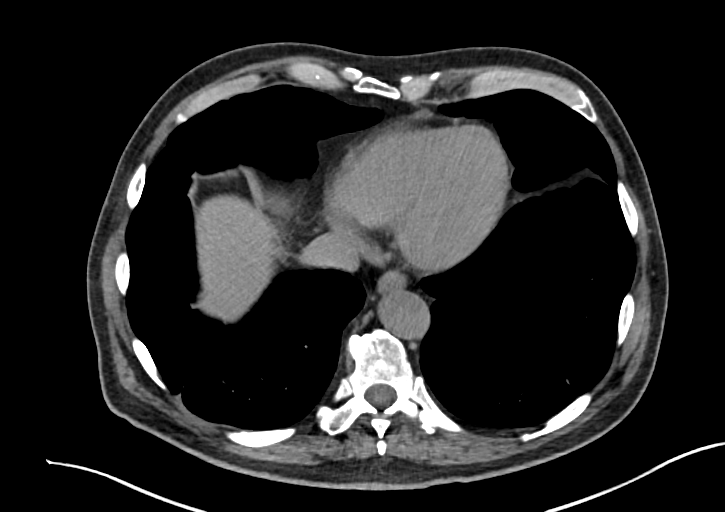
[im 125/134  bone]
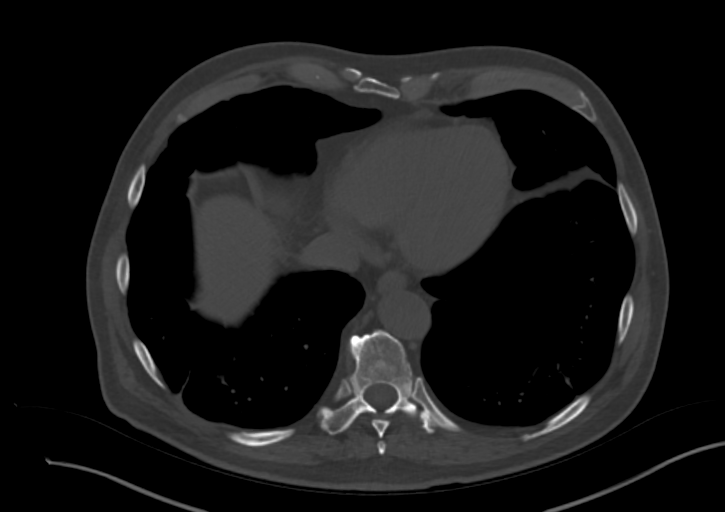

[Series 4: abdomen_pelvis cor 3.00 br40 s3 · coronal · 0.83mm/px · 3 of 102 slices shown]
[im 34/102  soft-tissue]
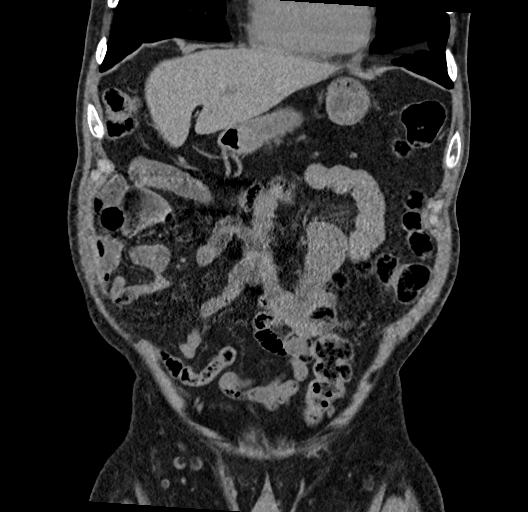
[im 45/102  soft-tissue]
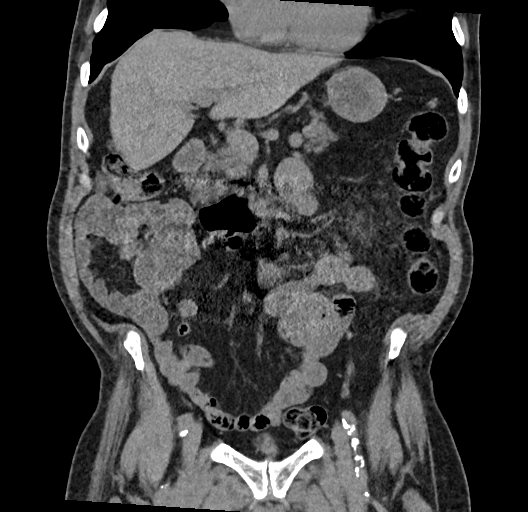
[im 57/102  soft-tissue]
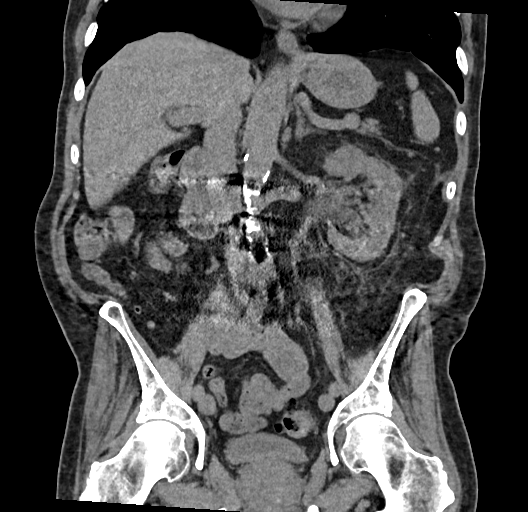

[12 of 46 positions shown; findings below may reference images not displayed]

Nagyfi, Istvan-Mihaly  * Location:

DIAGNOSTIC STUDIES

EXAM

CT abdomen pelvis without contrast

INDICATION

Abdominal pain-lower
CT abd/pelv wo. Pain across Lower abd, intermittant that will get worse with ambulation and rolling
around in bed. diarrhea x 2 weeks, Hx Right kidney removal. TB

TECHNIQUE

All CT scans at this facility use dose modulation, iterative reconstruction, and/or weight based
dosing when appropriate to reduce radiation dose to as low as reasonably achievable.

Number of previous computed tomography exams in the last 12 months is 0  .

Number of previous nuclear medicine myocardial perfusion studies in the last 12 months is 0  .

COMPARISONS

February 07, 2019

FINDINGS

Soft tissue density is noted within the right lower lobe posteriorly measuring 1.6 by 1.4 cm image
6 series 2. There is adjacent atelectasis or scarring.

Liver is unremarkable. Calcified 1.7 cm gallstone is noted within the gallbladder. Spleen is normal.

Adrenal glands are unremarkable. Prior right nephrectomy. No recurrent or residual mass is seen.
There is perinephric stranding on the left with prominence of the left renal collecting system. This
is similar to prior exam. No left renal calculi are seen.

There is atrophy of the pancreas. No retroperitoneal adenopathy. Calcified plaque is noted
throughout the abdominal pelvic vasculature.

There is colonic diverticulosis without diverticulitis. Normal appendix. Small bowel is normal in
caliber. No ascites or mesenteric adenopathy.

No pelvic adenopathy is evident. Bladder is slightly distended. Prior the rectal anastomosis is
seen.

Postop changes of the pelvis and lumbar spine are noted. Chronic compression deformity at L1 is re-
demonstrated.

IMPRESSION

Prior right nephrectomy without evidence for recurrent or residual mass.

Prominence of the left renal collecting system and perinephric stranding similar to prior exam.
Probable parapelvic renal cysts are also noted on the left.

Cholelithiasis and postop changes as described.

Tech Notes:

CT abd/pelv wo. Pain across Lower abd, intermittant that will get worse with ambulation and rolling
around in bed. diarrhea x 2 weeks, Hx Right kidney removal. TB
